# Patient Record
Sex: Female | Born: 1990 | Race: Black or African American | Hispanic: No | Marital: Single | Smoking: Current every day smoker
Health system: Southern US, Community
[De-identification: ages and names within clinical notes are randomized; demographics above are authoritative.]

## PROBLEM LIST (undated history)

## (undated) DIAGNOSIS — G932 Benign intracranial hypertension: Secondary | ICD-10-CM

---

## 2013-02-21 ENCOUNTER — Encounter (HOSPITAL_COMMUNITY): Payer: Self-pay | Admitting: *Deleted

## 2013-02-21 ENCOUNTER — Emergency Department (HOSPITAL_COMMUNITY)
Admission: EM | Admit: 2013-02-21 | Discharge: 2013-02-21 | Disposition: A | Discharge status: Home / Self Care | Payer: Self-pay | Attending: Emergency Medicine | Admitting: Emergency Medicine

## 2013-02-21 DIAGNOSIS — R112 Nausea with vomiting, unspecified: Secondary | ICD-10-CM | POA: Insufficient documentation

## 2013-02-21 DIAGNOSIS — Z91040 Latex allergy status: Secondary | ICD-10-CM | POA: Insufficient documentation

## 2013-02-21 DIAGNOSIS — Z3202 Encounter for pregnancy test, result negative: Secondary | ICD-10-CM | POA: Insufficient documentation

## 2013-02-21 DIAGNOSIS — F17200 Nicotine dependence, unspecified, uncomplicated: Secondary | ICD-10-CM | POA: Insufficient documentation

## 2013-02-21 DIAGNOSIS — M549 Dorsalgia, unspecified: Secondary | ICD-10-CM | POA: Insufficient documentation

## 2013-02-21 DIAGNOSIS — N920 Excessive and frequent menstruation with regular cycle: Secondary | ICD-10-CM | POA: Insufficient documentation

## 2013-02-21 LAB — URINALYSIS, ROUTINE W REFLEX MICROSCOPIC
Glucose, UA: NEGATIVE mg/dL
Leukocytes, UA: NEGATIVE
Nitrite: NEGATIVE
Protein, ur: NEGATIVE mg/dL
pH: 6 (ref 5.0–8.0)

## 2013-02-21 LAB — PREGNANCY, URINE: Preg Test, Ur: NEGATIVE

## 2013-02-21 LAB — WET PREP, GENITAL: Yeast Wet Prep HPF POC: NEGATIVE — AB

## 2013-02-21 MED ORDER — AZITHROMYCIN 1 G PO PACK
1.0000 g | PACK | Freq: Once | ORAL | Status: AC
  Administered 2013-02-21: 1 g via ORAL
  Filled 2013-02-21: qty 1

## 2013-02-21 MED ORDER — CEFTRIAXONE SODIUM 250 MG IJ SOLR
250.0000 mg | Freq: Once | INTRAMUSCULAR | Status: AC
  Administered 2013-02-21: 250 mg via INTRAMUSCULAR
  Filled 2013-02-21: qty 250

## 2013-02-21 MED ORDER — ONDANSETRON 4 MG PO TBDP
8.0000 mg | ORAL_TABLET | Freq: Once | ORAL | Status: AC
  Administered 2013-02-21: 8 mg via ORAL
  Filled 2013-02-21: qty 2

## 2013-02-21 MED ORDER — HYDROMORPHONE HCL PF 1 MG/ML IJ SOLN
1.0000 mg | Freq: Once | INTRAMUSCULAR | Status: AC
  Administered 2013-02-21: 1 mg via INTRAMUSCULAR
  Filled 2013-02-21: qty 1

## 2013-02-21 MED ORDER — LIDOCAINE HCL (PF) 1 % IJ SOLN
INTRAMUSCULAR | Status: AC
  Administered 2013-02-21: 1 mL
  Filled 2013-02-21: qty 5

## 2013-02-21 NOTE — ED Provider Notes (Signed)
CSN: [id number]     Arrival date & time 02/21/13  1815 History     First MD Initiated Contact with Patient 02/21/13 1818     Chief Complaint  Patient presents with  . Abdominal Pain   (Consider location/radiation/quality/duration/timing/severity/associated sxs/prior Treatment) HPI Comments: Patient is a G0P0 22 yo F presenting to the ED for acute onset intermittent sharp right sided pelvic pain w/ radiation to back and left side of pelvis. Patient rates her pain currently 8/10 with marijuana use alleviating her pain. She denies any aggravating pain. Patient has associated nausea and one episode of vomiting this afternoon after trying milk of magnesia for her abdominal pain. Pt states she typically has very painful first days of her menstrual cycles, but was concerned after she vomited. She states she has only gone through one menstrual pad today and is not currently on any form of birth control. Patient endorses recent history of unprotected sexual intercourse. She denies any history of STIs. She denies any fevers or chills, diarrhea, urinary symptoms.   Patient is a 22 y.o. female presenting with abdominal pain.  Abdominal Pain Associated symptoms include nausea and vomiting. Pertinent negatives include no chills or fever.     History reviewed. No pertinent past medical history. History reviewed. No pertinent past surgical history. History reviewed. No pertinent family history. History  Substance Use Topics  . Smoking status: Current Every Day Smoker    Types: Cigarettes  . Smokeless tobacco: Never Used  . Alcohol Use: Yes   OB History   Grav Para Term Preterm Abortions TAB SAB Ect Mult Living                 Review of Systems  Constitutional: Negative for fever and chills.  Gastrointestinal: Positive for nausea and vomiting. Negative for diarrhea, constipation and blood in stool.  Genitourinary: Positive for vaginal bleeding, menstrual problem and pelvic pain.  Musculoskeletal:  Positive for back pain.  Skin: Negative.   Allergic/Immunologic: Positive for food allergies.  All other systems reviewed and are negative.    Allergies  Coconut fatty acids; Shellfish allergy; Lactose intolerance (gi); and Latex  Home Medications   Current Outpatient Rx  Name  Route  Sig  Dispense  Refill  . Magnesium Hydroxide (MILK OF MAGNESIA PO)   Oral   Take 1 Units by mouth daily as needed (for nausea).          BP 112/60  Pulse 78  Temp(Src) 98.6 F (37 C) (Oral)  Resp 20  SpO2 100%  LMP 02/21/2013 Physical Exam  Constitutional: She is oriented to person, place, and time. She appears well-developed and well-nourished.  HENT:  Head: Normocephalic and atraumatic.  Mouth/Throat: Oropharynx is clear and moist.  Eyes: Conjunctivae are normal.  Neck: Neck supple.  Cardiovascular: Normal rate, regular rhythm, normal heart sounds and intact distal pulses.   Pulmonary/Chest: Effort normal and breath sounds normal.  Abdominal: Soft. Bowel sounds are normal. She exhibits no distension. There is no tenderness. There is no rebound and no guarding.  Musculoskeletal: Normal range of motion. She exhibits no edema.  Neurological: She is alert and oriented to person, place, and time.  Skin: Skin is warm and dry.   Exam performed by Francee Piccolo L,  exam chaperoned Date: 02/21/2013 Pelvic exam: normal external genitalia without evidence of trauma. VULVA: normal appearing vulva with no masses, tenderness or lesion. VAGINA: normal appearing vagina with normal color and discharge, no lesions. CERVIX: normal appearing cervix without lesions, cervical  motion tenderness absent, cervical os closed with out purulent discharge; vaginal discharge - bloody, Wet prep and DNA probe for chlamydia and GC obtained.   ADNEXA: normal adnexa in size, nontender and no masses UTERUS: uterus is normal size, shape, consistency and nontender.    ED Course   Procedures (including critical  care time)  Labs Reviewed  WET PREP, GENITAL - Abnormal; Notable for the following:    Yeast Wet Prep HPF POC NEGATIVE (*)    Trich, Wet Prep NEGATIVE (*)    Clue Cells Wet Prep HPF POC NEGATIVE (*)    WBC, Wet Prep HPF POC FEW (*)    All other components within normal limits  URINALYSIS, ROUTINE W REFLEX MICROSCOPIC - Abnormal; Notable for the following:    APPearance CLOUDY (*)    Ketones, ur 15 (*)    All other components within normal limits  GC/CHLAMYDIA PROBE AMP  PREGNANCY, URINE   No results found. 1. Menorrhagia     MDM  Patient is nontoxic, nonseptic appearing, in no apparent distress.  Patient's pain and other symptoms adequately managed in emergency department.  Fluids tolerated in ED.  Labs, and vitals reviewed.  Patient does not meet the SIRS or Sepsis criteria.  On repeat exam patient does not have a surgical abdomen and there are nor peritoneal signs.  No indication of appendicitis, bowel obstruction, bowel perforation, cholecystitis, diverticulitis, PID or ectopic pregnancy.  Pt will be treated with Rocephin and Zithromax in ED with history of recent unprotected sex and WBCs on Wet prep. Patient discharged home with symptomatic treatment and given strict instructions for follow-up with their primary care physician.  I have also discussed reasons to return immediately to the ER.  Advised Gyn f/u for recurrent menstrual cycle pain. Patient expresses understanding and agrees with plan. Patient d/w with Dr. Radford Pax, agrees with plan.        Sandra Ellis, PA-C 02/22/13 [phone]

## 2013-02-21 NOTE — ED Notes (Signed)
Pt.has c/o abdominal pain and has started her period today.

## 2013-02-25 NOTE — ED Provider Notes (Signed)
Medical screening examination/treatment/procedure(s) were performed by non-physician practitioner and as supervising physician I was immediately available for consultation/collaboration.    Valentina Alcoser L Joselyne Spake, MD 02/25/13 0811 

## 2013-03-11 ENCOUNTER — Telehealth (HOSPITAL_COMMUNITY): Payer: Self-pay | Admitting: *Deleted

## 2013-03-29 ENCOUNTER — Telehealth (HOSPITAL_COMMUNITY): Payer: Self-pay | Admitting: Emergency Medicine

## 2013-03-29 ENCOUNTER — Encounter (HOSPITAL_COMMUNITY): Payer: Self-pay | Admitting: *Deleted

## 2013-03-29 ENCOUNTER — Emergency Department (HOSPITAL_COMMUNITY)
Admission: EM | Admit: 2013-03-29 | Discharge: 2013-03-29 | Discharge status: Against Medical Advice | Payer: Self-pay | Attending: Emergency Medicine | Admitting: Emergency Medicine

## 2013-03-29 ENCOUNTER — Emergency Department (HOSPITAL_COMMUNITY)
Admission: EM | Admit: 2013-03-29 | Discharge: 2013-03-29 | Disposition: A | Discharge status: Home / Self Care | Payer: Self-pay | Attending: Emergency Medicine | Admitting: Emergency Medicine

## 2013-03-29 ENCOUNTER — Encounter (HOSPITAL_COMMUNITY): Payer: Self-pay | Admitting: Emergency Medicine

## 2013-03-29 DIAGNOSIS — F17200 Nicotine dependence, unspecified, uncomplicated: Secondary | ICD-10-CM | POA: Insufficient documentation

## 2013-03-29 DIAGNOSIS — R112 Nausea with vomiting, unspecified: Secondary | ICD-10-CM | POA: Insufficient documentation

## 2013-03-29 DIAGNOSIS — G932 Benign intracranial hypertension: Secondary | ICD-10-CM | POA: Insufficient documentation

## 2013-03-29 DIAGNOSIS — Z91040 Latex allergy status: Secondary | ICD-10-CM | POA: Insufficient documentation

## 2013-03-29 DIAGNOSIS — J029 Acute pharyngitis, unspecified: Secondary | ICD-10-CM | POA: Insufficient documentation

## 2013-03-29 DIAGNOSIS — R109 Unspecified abdominal pain: Secondary | ICD-10-CM | POA: Insufficient documentation

## 2013-03-29 HISTORY — DX: Benign intracranial hypertension: G93.2

## 2013-03-29 LAB — CBC WITH DIFFERENTIAL/PLATELET
Eosinophils Absolute: 0 10*3/uL (ref 0.0–0.7)
Eosinophils Relative: 0 % (ref 0–5)
HCT: 37.8 % (ref 36.0–46.0)
Lymphocytes Relative: 17 % (ref 12–46)
Lymphs Abs: 1.9 10*3/uL (ref 0.7–4.0)
MCH: 33.8 pg (ref 26.0–34.0)
MCV: 94.5 fL (ref 78.0–100.0)
Monocytes Absolute: 0.7 10*3/uL (ref 0.1–1.0)
Monocytes Relative: 6 % (ref 3–12)
Platelets: 190 10*3/uL (ref 150–400)
RBC: 4 MIL/uL (ref 3.87–5.11)
WBC: 11.1 10*3/uL — ABNORMAL HIGH (ref 4.0–10.5)

## 2013-03-29 LAB — COMPREHENSIVE METABOLIC PANEL
ALT: 18 U/L (ref 0–35)
BUN: 10 mg/dL (ref 6–23)
CO2: 20 mEq/L (ref 19–32)
Calcium: 9.5 mg/dL (ref 8.4–10.5)
Creatinine, Ser: 0.74 mg/dL (ref 0.50–1.10)
GFR calc Af Amer: >90 mL/min (ref >90)
GFR calc non Af Amer: >90 mL/min (ref >90)
Glucose, Bld: 100 mg/dL — ABNORMAL HIGH (ref 70–99)
Sodium: 138 mEq/L (ref 135–145)
Total Protein: 7.7 g/dL (ref 6.0–8.3)

## 2013-03-29 MED ORDER — SODIUM CHLORIDE 0.9 % IV BOLUS (SEPSIS)
1000.0000 mL | Freq: Once | INTRAVENOUS | Status: AC
  Administered 2013-03-29: 1000 mL via INTRAVENOUS

## 2013-03-29 MED ORDER — ONDANSETRON HCL 4 MG PO TABS: 4.0000 mg | ORAL_TABLET | Freq: Four times a day (QID) | ORAL | Status: DC

## 2013-03-29 MED ORDER — ONDANSETRON HCL 4 MG/2ML IJ SOLN
4.0000 mg | Freq: Once | INTRAMUSCULAR | Status: AC
  Administered 2013-03-29: 4 mg via INTRAVENOUS
  Filled 2013-03-29: qty 2

## 2013-03-29 MED ORDER — DICYCLOMINE HCL 10 MG PO CAPS
10.0000 mg | ORAL_CAPSULE | Freq: Once | ORAL | Status: AC
  Administered 2013-03-29: 10 mg via ORAL
  Filled 2013-03-29: qty 1

## 2013-03-29 NOTE — ED Notes (Signed)
Called in triage with no answer at 1555

## 2013-03-29 NOTE — ED Notes (Signed)
Vomiting for one hour.  lmp last saturday

## 2013-03-29 NOTE — ED Provider Notes (Signed)
CSN: [id number]     Arrival date & time 03/29/13  0205 History   First MD Initiated Contact with Patient 03/29/13 0221     Chief Complaint  Patient presents with  . Emesis   (Consider location/radiation/quality/duration/timing/severity/associated sxs/prior Treatment) Patient is a 22 y.o. female presenting with vomiting. The history is provided by the patient. No language interpreter was used.  Emesis Severity:  Moderate Duration:  1 hour Timing:  Intermittent Number of daily episodes:  11 Quality:  Undigested food and bilious material Able to tolerate:  Liquids and solids Progression:  Unchanged Chronicity:  New Recent urination:  Normal Relieved by:  Nothing Worsened by:  Nothing tried Ineffective treatments:  None tried Associated symptoms: abdominal pain (soreness after multiple episodes of emesis, worse w/ vomiting)   Associated symptoms: no arthralgias, no chills, no cough, no diarrhea, no fever, no headaches, no myalgias, no sore throat and no URI     Past Medical History  Diagnosis Date  . Pseudotumor cerebri    History reviewed. No pertinent past surgical history. No family history on file. History  Substance Use Topics  . Smoking status: Current Every Day Smoker    Types: Cigarettes  . Smokeless tobacco: Never Used  . Alcohol Use: Yes   OB History   Grav Para Term Preterm Abortions TAB SAB Ect Mult Living                 Review of Systems  Constitutional: Negative for fever, chills, diaphoresis, activity change, appetite change and fatigue.  HENT: Negative for congestion, sore throat, facial swelling, rhinorrhea, neck pain and neck stiffness.   Eyes: Negative for photophobia and discharge.  Respiratory: Negative for cough, chest tightness and shortness of breath.   Cardiovascular: Negative for chest pain, palpitations and leg swelling.  Gastrointestinal: Positive for vomiting and abdominal pain (soreness after multiple episodes of emesis, worse w/ vomiting).  Negative for nausea and diarrhea.  Endocrine: Negative for polydipsia and polyuria.  Genitourinary: Negative for dysuria, frequency, difficulty urinating and pelvic pain.  Musculoskeletal: Negative for myalgias, back pain and arthralgias.  Skin: Negative for color change and wound.  Allergic/Immunologic: Negative for immunocompromised state.  Neurological: Negative for facial asymmetry, weakness, numbness and headaches.  Hematological: Does not bruise/bleed easily.  Psychiatric/Behavioral: Negative for confusion and agitation.    Allergies  Coconut fatty acids; Shellfish allergy; Lactose intolerance (gi); and Latex  Home Medications   Current Outpatient Rx  Name  Route  Sig  Dispense  Refill  . Magnesium Hydroxide (MILK OF MAGNESIA PO)   Oral   Take 30 mLs by mouth daily as needed (for nausea).          . ondansetron (ZOFRAN) 4 MG tablet   Oral   Take 4 mg by mouth every 6 (six) hours as needed for nausea.          BP 111/64  Pulse 72  Temp(Src) 97.5 F (36.4 C)  Resp 20  SpO2 100%  LMP 03/27/2013 Physical Exam  Constitutional: She is oriented to person, place, and time. She appears well-developed and well-nourished. No distress.  HENT:  Head: Normocephalic and atraumatic.  Mouth/Throat: No oropharyngeal exudate.  Eyes: Pupils are equal, round, and reactive to light.  Neck: Normal range of motion. Neck supple.  Cardiovascular: Normal rate, regular rhythm and normal heart sounds.  Exam reveals no gallop and no friction rub.   No murmur heard. Pulmonary/Chest: Effort normal and breath sounds normal. No respiratory distress. She has no wheezes.  She has no rales.  Abdominal: Soft. Bowel sounds are normal. She exhibits no distension and no mass. There is no tenderness. There is no rebound and no guarding.  Musculoskeletal: Normal range of motion. She exhibits no edema and no tenderness.  Neurological: She is alert and oriented to person, place, and time.  Skin: Skin is  warm and dry.  Psychiatric: She has a normal mood and affect.    ED Course  Procedures (including critical care time) Labs Review Labs Reviewed  CBC WITH DIFFERENTIAL - Abnormal; Notable for the following:    WBC 11.1 (*)    Neutro Abs 8.5 (*)    All other components within normal limits  COMPREHENSIVE METABOLIC PANEL - Abnormal; Notable for the following:    Potassium 3.2 (*)    Glucose, Bld 100 (*)    Alkaline Phosphatase 36 (*)    Total Bilirubin 1.4 (*)    All other components within normal limits   Imaging Review No results found.  MDM   1. Nausea & vomiting    Pt is a 22 y.o. female with Pmhx as above who presents with 1 hrs of n/v, now with abdominal soreness.  VSS< pt in NAD on PE, abdomen non-tender.  She has mild leukocytosis, CMP unremarkable except for K 3.2, tBILI 1.4.  Symptoms resolved after IVF & zofran.  She would not stay for POC preg test, understands I cannot r/o pregnancy.  I doubt acute surgical emergency and suspect early viral gastroenteritis.   1. Nausea & vomiting         Shanna Cisco, MD 03/29/13 2104

## 2013-03-29 NOTE — ED Notes (Signed)
NO ANSWER IN LOBBY

## 2013-03-29 NOTE — ED Provider Notes (Signed)
CSN: [id number]     Arrival date & time 03/29/13  1203 History   First MD Initiated Contact with Patient 03/29/13 1526     Chief Complaint  Patient presents with  . Sore Throat   (Consider location/radiation/quality/duration/timing/severity/associated sxs/prior Treatment) HPI  PT LWBS from triage   Past Medical History  Diagnosis Date  . Pseudotumor cerebri    History reviewed. No pertinent past surgical history. History reviewed. No pertinent family history. History  Substance Use Topics  . Smoking status: Current Every Day Smoker    Types: Cigarettes  . Smokeless tobacco: Never Used  . Alcohol Use: Yes   OB History   Grav Para Term Preterm Abortions TAB SAB Ect Mult Living                 Review of Systems  Allergies  Coconut fatty acids; Shellfish allergy; Lactose intolerance (gi); and Latex  Home Medications   Current Outpatient Rx  Name  Route  Sig  Dispense  Refill  . Magnesium Hydroxide (MILK OF MAGNESIA PO)   Oral   Take 30 mLs by mouth daily as needed (for nausea).          . ondansetron (ZOFRAN) 4 MG tablet   Oral   Take 4 mg by mouth every 6 (six) hours as needed for nausea.          BP 124/76  Pulse 76  Temp(Src) 98.4 F (36.9 C) (Oral)  Resp 18  SpO2 99%  LMP 03/27/2013 Physical Exam  ED Course  Procedures (including critical care time) Labs Review Labs Reviewed - No data to display Imaging Review No results found.  MDM  No diagnosis found.   PT LWBS from triage    Wynetta Emery, PA-C 03/29/13 2358

## 2013-03-29 NOTE — ED Notes (Signed)
Pt sts seen here last night and given meds for vomiting; pt sts sore throat prior to vomiting but now having more pain and thinks could be reaction to meds given here; pt in no distress handling secretions and speaking clearly; no abnormality noted to throat

## 2013-03-31 NOTE — ED Provider Notes (Signed)
Medical screening examination/treatment/procedure(s) were performed by non-physician practitioner and as supervising physician I was immediately available for consultation/collaboration.   Erendida Wrenn, MD 03/31/13 1518 

## 2013-05-23 ENCOUNTER — Encounter (HOSPITAL_COMMUNITY): Payer: Self-pay | Admitting: Emergency Medicine

## 2013-05-23 ENCOUNTER — Emergency Department (HOSPITAL_COMMUNITY)
Admission: EM | Admit: 2013-05-23 | Discharge: 2013-05-24 | Disposition: A | Discharge status: Home / Self Care | Payer: Self-pay | Attending: Emergency Medicine | Admitting: Emergency Medicine

## 2013-05-23 ENCOUNTER — Emergency Department (HOSPITAL_COMMUNITY)
Admission: EM | Admit: 2013-05-23 | Discharge: 2013-05-23 | Disposition: A | Discharge status: Home / Self Care | Payer: Self-pay | Attending: Emergency Medicine | Admitting: Emergency Medicine

## 2013-05-23 ENCOUNTER — Emergency Department (HOSPITAL_COMMUNITY): Payer: Self-pay

## 2013-05-23 DIAGNOSIS — Z3202 Encounter for pregnancy test, result negative: Secondary | ICD-10-CM | POA: Insufficient documentation

## 2013-05-23 DIAGNOSIS — G932 Benign intracranial hypertension: Secondary | ICD-10-CM | POA: Insufficient documentation

## 2013-05-23 DIAGNOSIS — Z91040 Latex allergy status: Secondary | ICD-10-CM | POA: Insufficient documentation

## 2013-05-23 DIAGNOSIS — Z79899 Other long term (current) drug therapy: Secondary | ICD-10-CM | POA: Insufficient documentation

## 2013-05-23 DIAGNOSIS — M549 Dorsalgia, unspecified: Secondary | ICD-10-CM | POA: Insufficient documentation

## 2013-05-23 DIAGNOSIS — F17200 Nicotine dependence, unspecified, uncomplicated: Secondary | ICD-10-CM | POA: Insufficient documentation

## 2013-05-23 DIAGNOSIS — R112 Nausea with vomiting, unspecified: Secondary | ICD-10-CM | POA: Insufficient documentation

## 2013-05-23 DIAGNOSIS — G971 Other reaction to spinal and lumbar puncture: Secondary | ICD-10-CM | POA: Insufficient documentation

## 2013-05-23 DIAGNOSIS — Z888 Allergy status to other drugs, medicaments and biological substances status: Secondary | ICD-10-CM | POA: Insufficient documentation

## 2013-05-23 DIAGNOSIS — R51 Headache: Secondary | ICD-10-CM | POA: Insufficient documentation

## 2013-05-23 LAB — BASIC METABOLIC PANEL
BUN: 4 mg/dL — ABNORMAL LOW (ref 6–23)
CO2: 18 mEq/L — ABNORMAL LOW (ref 19–32)
Chloride: 104 mEq/L (ref 96–112)
GFR calc Af Amer: >90 mL/min (ref >90)
Potassium: 3.7 mEq/L (ref 3.5–5.1)

## 2013-05-23 LAB — CBC WITH DIFFERENTIAL/PLATELET
Basophils Absolute: 0 10*3/uL (ref 0.0–0.1)
Basophils Relative: 0 % (ref 0–1)
HCT: 40 % (ref 36.0–46.0)
Hemoglobin: 13.7 g/dL (ref 12.0–15.0)
Lymphocytes Relative: 23 % (ref 12–46)
MCHC: 34.3 g/dL (ref 30.0–36.0)
Monocytes Absolute: 0.7 10*3/uL (ref 0.1–1.0)
Monocytes Relative: 7 % (ref 3–12)
Neutro Abs: 6.4 10*3/uL (ref 1.7–7.7)
Neutrophils Relative %: 69 % (ref 43–77)
WBC: 9.2 10*3/uL (ref 4.0–10.5)

## 2013-05-23 LAB — URINALYSIS, ROUTINE W REFLEX MICROSCOPIC
Glucose, UA: NEGATIVE mg/dL
Leukocytes, UA: NEGATIVE
Nitrite: NEGATIVE
Specific Gravity, Urine: 1.007 (ref 1.005–1.030)
pH: 7 (ref 5.0–8.0)

## 2013-05-23 MED ORDER — SODIUM CHLORIDE 0.9 % IV BOLUS (SEPSIS)
1000.0000 mL | Freq: Once | INTRAVENOUS | Status: AC
  Administered 2013-05-24: 1000 mL via INTRAVENOUS

## 2013-05-23 MED ORDER — ONDANSETRON HCL 4 MG/2ML IJ SOLN: 4.0000 mg | Freq: Once | INTRAMUSCULAR | Status: DC

## 2013-05-23 MED ORDER — METOCLOPRAMIDE HCL 5 MG/ML IJ SOLN
10.0000 mg | Freq: Once | INTRAMUSCULAR | Status: AC
  Administered 2013-05-23: 10 mg via INTRAVENOUS
  Filled 2013-05-23: qty 2

## 2013-05-23 MED ORDER — HYDROMORPHONE HCL PF 1 MG/ML IJ SOLN
1.0000 mg | Freq: Once | INTRAMUSCULAR | Status: AC
  Administered 2013-05-23: 1 mg via INTRAVENOUS
  Filled 2013-05-23: qty 1

## 2013-05-23 MED ORDER — OXYCODONE-ACETAMINOPHEN 5-325 MG PO TABS: 2.0000 | ORAL_TABLET | ORAL | Status: DC | PRN

## 2013-05-23 MED ORDER — SODIUM CHLORIDE 0.9 % IV BOLUS (SEPSIS)
1000.0000 mL | Freq: Once | INTRAVENOUS | Status: AC
  Administered 2013-05-23: 1000 mL via INTRAVENOUS

## 2013-05-23 NOTE — ED Notes (Signed)
Pt reports having oxycodone x 2 tabs at and a shot  Of phenergan 4 or 5pm today.

## 2013-05-23 NOTE — ED Notes (Signed)
Dr Manly at bedside 

## 2013-05-23 NOTE — ED Notes (Signed)
Per PTAR, pt has hx of migraines and pseudo tumors. Pt has had a migraine x1 week. Pt had spinal tap performed yesterday. Pt was seen here earlier today. CBG 64. Pt has nausea and vomiting with movement.

## 2013-05-23 NOTE — ED Notes (Signed)
Report received, assumed care.  

## 2013-05-23 NOTE — ED Provider Notes (Addendum)
CSN: [id number]     Arrival date & time 05/23/13  0443 History   First MD Initiated Contact with Patient 05/23/13 0530     Chief Complaint  Patient presents with  . Back Pain   (Consider location/radiation/quality/duration/timing/severity/associated sxs/prior Treatment) HPI This patient is a very pleasant 22 year old woman with history of pseudotumor cerebri diagnosed in 2011. She is today status post therapeutic lumbar puncture performed at [facility]. The patient says she was told that her opening pressure was 24 mmH2O. prior to lumbar puncture, she had one week of severe and diffuse headache pain associated with nausea but no vomiting. Her pain was similar to that which she experienced when she was initially diagnosed with pseudotumor 2001. She has not been able to afford her prescription medications.  The patient developed worsening and severe headache pain following her discharge from the [facility] department after her lumbar puncture. She returned later in the day and was treated for a post LP headache with, she says, 2 L of normal saline and caffeine and an anti-emetic. She said that she was still feeling bad at the time of her 2nd discharge, although her symptoms had improved with IVF and IV caffeine. She says she was told to return to the hospital to be admitted if her symptoms did not improve.  The patient has had persistent and severe, diffuse, aching pain in the head since 2nd discharge from ED yesterday. Her headache is positional and worse when she sits or stands. She feels most comfortable laying in the left lateral decubitus position. Pain is 10/10. Pt has vomited once. Denies fever. Denies visual changes. Denies any focal neurologic deficits. The patient also has pain at the site of LP. This pain radiates superiorly, up the back. .   Past Medical History  Diagnosis Date  . Pseudotumor cerebri    History reviewed. No pertinent past  surgical history. No family history on file. History  Substance Use Topics  . Smoking status: Current Every Day Smoker    Types: Cigarettes  . Smokeless tobacco: Never Used  . Alcohol Use: Yes   OB History   Grav Para Term Preterm Abortions TAB SAB Ect Mult Living                 Review of Systems Gen: no weight loss, fevers, chills, night sweats Eyes: no discharge or drainage, no occular pain or visual changes Nose: no epistaxis or rhinorrhea Mouth: no dental pain, no sore throat Neck: no neck pain Lungs: no SOB, cough, wheezing CV: no chest pain, palpitations, dependent edema or orthopnea Abd: As per history of present illness, otherwise negative GU: no dysuria or gross hematuria MSK: no myalgias or arthralgias Neuro: As per history of present illness, otherwise negative Skin: no rash Psyche: negative.  Allergies  Coconut fatty acids; Shellfish allergy; Zofran; Lactose intolerance (gi); and Latex  Home Medications  No current outpatient prescriptions on file. BP 120/78  Pulse 66  Temp(Src) 99.4 F (37.4 C) (Oral)  Resp 18  SpO2 100%  LMP 05/13/2013 Physical Exam  Gen: well developed and well nourished appearing, appears uncomfortable, tearful.  Head: NCAT Eyes: PERL, EOMI, mild papilledema is noted on non-dilated exam  Nose: no epistaixis or rhinorrhea Mouth/throat: mucosa is moist and pink Neck: supple, no stridor Lungs: CTA B, no wheezing, rhonchi or rales CV: RRR, no murmur, ext well perfused Abd: soft, notender, nondistended Back:LP site is c/d/i without ttp or surrounding erythema Skin: warm and  dry Neuro: CN ii-xii grossly intact, normal speech, motor strength 5/5 both arms and legs, no dysmetria Psyche; normal affect,  calm and cooperative.  ED Course  Procedures (including critical care time) Labs Review Results for orders placed during the hospital encounter of 05/23/13 (from the past 24 hour(s))  CBC WITH DIFFERENTIAL     Status: None    Collection Time    05/23/13  5:12 AM      Result Value Range   WBC 9.2  4.0 - 10.5 K/uL   RBC 4.18  3.87 - 5.11 MIL/uL   Hemoglobin 13.7  12.0 - 15.0 g/dL   HCT 16.1  09.6 - 04.5 %   MCV 95.7  78.0 - 100.0 fL   MCH 32.8  26.0 - 34.0 pg   MCHC 34.3  30.0 - 36.0 g/dL   RDW 40.9  81.1 - 91.4 %   Platelets 202  150 - 400 K/uL   Neutrophils Relative % 69  43 - 77 %   Neutro Abs 6.4  1.7 - 7.7 K/uL   Lymphocytes Relative 23  12 - 46 %   Lymphs Abs 2.1  0.7 - 4.0 K/uL   Monocytes Relative 7  3 - 12 %   Monocytes Absolute 0.7  0.1 - 1.0 K/uL   Eosinophils Relative 0  0 - 5 %   Eosinophils Absolute 0.0  0.0 - 0.7 K/uL   Basophils Relative 0  0 - 1 %   Basophils Absolute 0.0  0.0 - 0.1 K/uL  BASIC METABOLIC PANEL     Status: Abnormal   Collection Time    05/23/13  5:12 AM      Result Value Range   Sodium 135  135 - 145 mEq/L   Potassium 3.7  3.5 - 5.1 mEq/L   Chloride 104  96 - 112 mEq/L   CO2 18 (*) 19 - 32 mEq/L   Glucose, Bld 97  70 - 99 mg/dL   BUN 4 (*) 6 - 23 mg/dL   Creatinine, Ser 7.82  0.50 - 1.10 mg/dL   Calcium 9.7  8.4 - 95.6 mg/dL   GFR calc non Af Amer >90  >90 mL/min   GFR calc Af Amer >90  >90 mL/min  URINALYSIS, ROUTINE W REFLEX MICROSCOPIC     Status: Abnormal   Collection Time    05/23/13  5:44 AM      Result Value Range   Color, Urine YELLOW  YELLOW   APPearance CLOUDY (*) CLEAR   Specific Gravity, Urine 1.007  1.005 - 1.030   pH 7.0  5.0 - 8.0   Glucose, UA NEGATIVE  NEGATIVE mg/dL   Hgb urine dipstick NEGATIVE  NEGATIVE   Bilirubin Urine NEGATIVE  NEGATIVE   Ketones, ur 40 (*) NEGATIVE mg/dL   Protein, ur NEGATIVE  NEGATIVE mg/dL   Urobilinogen, UA 0.2  0.0 - 1.0 mg/dL   Nitrite NEGATIVE  NEGATIVE   Leukocytes, UA NEGATIVE  NEGATIVE  PREGNANCY, URINE     Status: None   Collection Time    05/23/13  5:44 AM      Result Value Range   Preg Test, Ur NEGATIVE  NEGATIVE   Head CT:  NAICP  MDM   Patient with classic post LP headache. Failed attempt at  medical intervention yesterday at [facility] ED with IVF and caffeine. We are treating symptomatically at this time. Anesthesiology paged to request blood patch.   0710: Case discussed with Dr. Sampson Goon of Anesthesia who  has informed me that the Dept of Radiology with IR is now responsible for performing blood patches.   0720: Case discussed with Dr. Gilford Rile of IR. He says that blood patch is not available via IR on the weekend but, he will speak with the Neuroradiologist who is coming is and see if we can get the procedure performed for the patient. Case signed out to Dr. Jodi Mourning at change of shift.   Brandt Loosen, MD 05/23/13 [phone]  Case discussed with Dr. Donette Larry at [facility]. He says that he is happy to accept the patient to the ED there and attempt to get flouro guided blood patch but, he could not gaurauntee that he would be able to get this procedure done for the patient either. I discussed this with the patient. She is feeling better and says she is able to go home with plan to follow up with [facility] in the morning for outpatient procedure. Counseled re: return precautions.   Brandt Loosen, MD 05/23/13 [phone]

## 2013-05-23 NOTE — ED Notes (Signed)
PER PTAR- pt was seen at [facility] yesterday and had an LP done for her HA and reports increased back pain, HA, right arm pain. Pt also reports N/V/D and "every position hurts." VS: HR-84, O2-96%, RR-18

## 2013-05-24 ENCOUNTER — Emergency Department (HOSPITAL_COMMUNITY): Payer: Self-pay

## 2013-05-24 MED ORDER — IOHEXOL 180 MG/ML  SOLN
20.0000 mL | Freq: Once | INTRAMUSCULAR | Status: AC | PRN
  Administered 2013-05-24: 5 mL via INTRATHECAL

## 2013-05-24 MED ORDER — METOCLOPRAMIDE HCL 5 MG/ML IJ SOLN
10.0000 mg | Freq: Once | INTRAMUSCULAR | Status: AC
  Administered 2013-05-24: 10 mg via INTRAVENOUS
  Filled 2013-05-24: qty 2

## 2013-05-24 MED ORDER — HYDROMORPHONE HCL PF 1 MG/ML IJ SOLN
1.0000 mg | Freq: Once | INTRAMUSCULAR | Status: AC
  Administered 2013-05-24: 1 mg via INTRAVENOUS
  Filled 2013-05-24: qty 1

## 2013-05-24 NOTE — ED Notes (Signed)
Dr. Carlota Raspberry (Interventional radiologist) notified of pt's intolerance of sitting up-- stated that is normal and to be expected. Will get d/c instructions from EDP.

## 2013-05-24 NOTE — Procedures (Signed)
See radiology dictation.

## 2013-05-24 NOTE — ED Provider Notes (Signed)
Pt received in t/o from Dr. Arnoldo Morale at 0800.  22 yo female presenting for HA suspected to be a post LP HA.  Pt is pending a blood patch to be performed by Neuroradiology.    11:42 AM Pt had successful blood patch placement.  I examined pt and she states her HA is much better.  She does report back pain.  Neuroradiology states this is to be expected.  Normal neuro exam on d/c - moving LE w/o issue, sensation intact, DTRs normal.  Lengthy d/w pt, her mom, and her uncle regarding discharge instructions.  Filed Vitals:   05/24/13 1031  BP: 101/70  Pulse: 74  Temp: 98.7 F (37.1 C)  Resp: 14      Pt is to have 24hr bed rest. Pt is to fill her Rx which she was given previously Pt is to f/u with [facility] [facility].   ER precautions for fever, abd pain, persistent n/v, neurologic symptoms or worsening symptoms.  Pt and family agree with plan.    Darlys Gales, MD 05/24/13 1146

## 2013-05-24 NOTE — ED Provider Notes (Signed)
CSN: [id number]     Arrival date & time 05/23/13  2316 History   First MD Initiated Contact with Patient 05/23/13 2326     Chief Complaint  Patient presents with  . Migraine   (Consider location/radiation/quality/duration/timing/severity/associated sxs/prior Treatment) HPI He said that this is a late entry. This patient was seen and examined by me upon her presentation to the emergency department. Please see my note from November 2. The patient was discharged with plan for outpatient blood patch to treat spinal headache. However, she says she developed nausea, vomiting and persistent headache in the waiting room after she was discharge yesterday. She continues to have severe and diffuse headache pain without any new neurologic deficits. She did not get her prescriptions filled.   Past Medical History  Diagnosis Date  . Pseudotumor cerebri    History reviewed. No pertinent past surgical history. No family history on file. History  Substance Use Topics  . Smoking status: Current Every Day Smoker    Types: Cigarettes  . Smokeless tobacco: Never Used  . Alcohol Use: Yes   OB History   Grav Para Term Preterm Abortions TAB SAB Ect Mult Living                 Review of Systems Gen: no weight loss, fevers, chills, night sweats, general malaise is present. Eyes: no discharge or drainage, no occular pain or visual changes Nose: no epistaxis or rhinorrhea Mouth: no dental pain, no sore throat Neck: no neck pain Lungs: no SOB, cough, wheezing CV: no chest pain, palpitations, dependent edema or orthopnea Abd: As per history of present illness, otherwise negative GU: no dysuria or gross hematuria MSK: no myalgias or arthralgias Neuro: Per history of present illness, otherwise negative Skin: no rash Psyche: negative. Allergies  Coconut fatty acids; Shellfish allergy; Zofran; Lactose intolerance (gi); and Latex  Home Medications   Current Outpatient Rx  Name  Route  Sig  Dispense   Refill  . oxyCODONE-acetaminophen (PERCOCET) 5-325 MG per tablet   Oral   Take 2 tablets by mouth every 4 (four) hours as needed for pain.   16 tablet   0   . promethazine (PHENERGAN) 25 MG/ML injection   Intramuscular   Inject 25 mg into the muscle once.          BP 121/66  Pulse 73  Temp(Src) 98.7 F (37.1 C) (Oral)  Resp 16  SpO2 100%  LMP 05/13/2013 Physical Exam Gen: well developed and well nourished appearing Head: NCAT Eyes: PERL, EOMI, papilledema present - no change from 11/2 Nose: no epistaixis or rhinorrhea Mouth/throat: mucosa is moist and pink Neck: supple, no stridor Lungs: CTA B, no wheezing, rhonchi or rales CV: RRR, no murmur,  Abd: soft, notender, nondistended Back: nontender, site of LP is c/d/i, no ttp Skin: warm and dry Neuro: CN ii-xii grossly intact, no focal deficits Psyche; flat affect,  calm and cooperative.   ED Course  Procedures (including critical care time) Labs Review Labs Reviewed - No data to display Imaging Review Ct Head Wo Contrast  05/23/2013   CLINICAL DATA:  Headache. History of pseudotumor cerebrum  EXAM: CT HEAD WITHOUT CONTRAST  TECHNIQUE: Contiguous axial images were obtained from the base of the skull through the vertex without intravenous contrast.  COMPARISON:  None.  FINDINGS: Skull and Sinuses:No cause for headache seen.  Orbits: No acute abnormality.  Brain: No evidence of acute abnormality, such as acute infarction, hemorrhage, hydrocephalus, or mass lesion/mass effect.  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  There is enlargement of the sella by low-attenuation. This "empty sella" appearance is often incidental, but has been associated with pseudotumor cerebrum. No evidence of dural venous sinus thrombosis.  IMPRESSION: No evidence of acute intracranial disease. Non acute findings discussed above.   Electronically Signed   By: Tiburcio Pea M.D.   On: 05/23/2013 06:42    Please see labs from yesterday  MDM    We have treated supportively in the ED as we await the neuroradiology staff to come in so that we may arrange blood patch for persistent post LP headache.    Brandt Loosen, MD 05/24/13 [phone]

## 2013-05-24 NOTE — ED Notes (Signed)
Returned from [place]-- orders received

## 2013-05-24 NOTE — ED Notes (Signed)
Pt has court date today-- due in  Court at 10am- uncle given note to take to court house-- Public Defender Amy called to have another note faxed with current date on. Faxed note to [phone]. Pt unable to appear in court due to care rendered today- will be on bedrest with bathroom privileges for 24 hours.

## 2013-05-24 NOTE — ED Notes (Signed)
Pt went to restroom, when she stood up she became nauseated and vomited once while in the bathroom.

## 2013-05-24 NOTE — Progress Notes (Signed)
Case Manager met patient at bedside.Role of Case Manager explained.Patient verbalizes her understanding.Patient reports she has increased post LP head ache. Patient does not have a PCP. Education provided on importance of establishing a PCP.Patient was provided with a resource sheet for the [facility]gent [facility] center. This Clinical research associate explained to patient With her consent,this Case Manager can e-mail the [facility] and help set up her PCP appointment.Patient receptive to this and provided her consent.Patient provided with resource  Card for the [address].

## 2013-05-24 NOTE — ED Notes (Signed)
Pt asking if she should be admitted because this is the 4th time she has been here and she must need more care.  Advised pt to speak with physician when she came into her room regarding her concerns.

## 2013-05-24 NOTE — ED Notes (Signed)
Tolerating sitting up at 30 degrees. Unable to tolerate raising head of bed further at this time. Used bedpan,

## 2013-07-13 ENCOUNTER — Ambulatory Visit: Payer: Self-pay | Admitting: Internal Medicine

## 2014-01-15 IMAGING — CT CT HEAD W/O CM
1 series · 16 of 30 positions shown, 20 images · non-contrast
Comparison: None.

CLINICAL DATA: Headache. History of pseudotumor cerebrum

EXAM:
CT HEAD WITHOUT CONTRAST
TECHNIQUE: Contiguous axial images were obtained from the base of the skull
through the vertex without intravenous contrast.

[Series 2: head 5.0 h30s · axial · 0.43mm/px · z∈[-136,-1]mm · 16 of 30 slices shown, 20 images]
[im 2/30  brain]
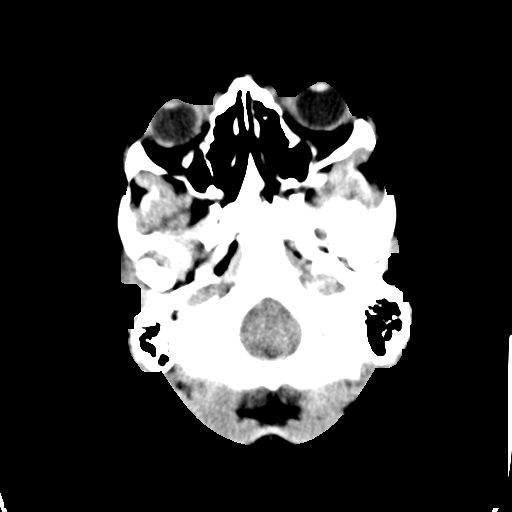
[im 2/30  bone]
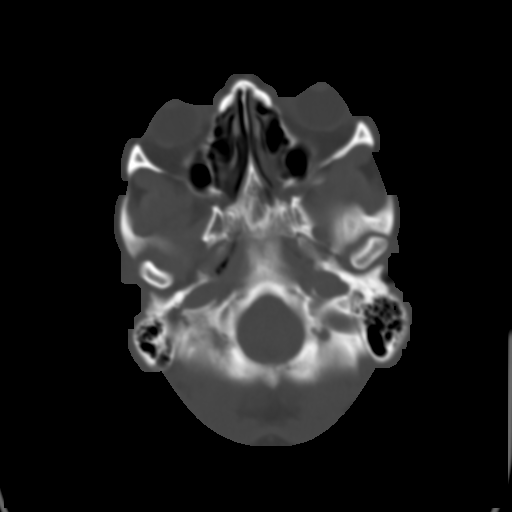
[im 4/30  brain]
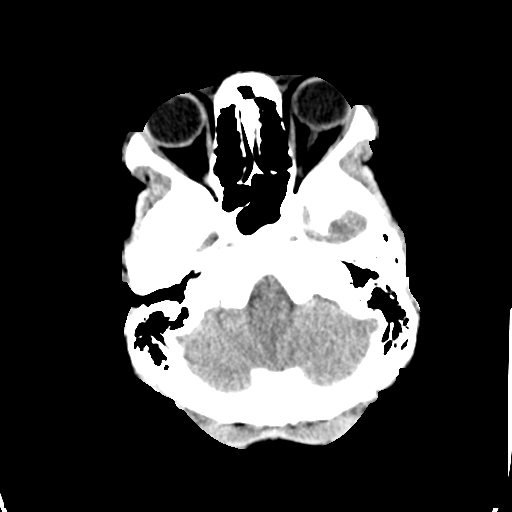
[im 6/30  brain]
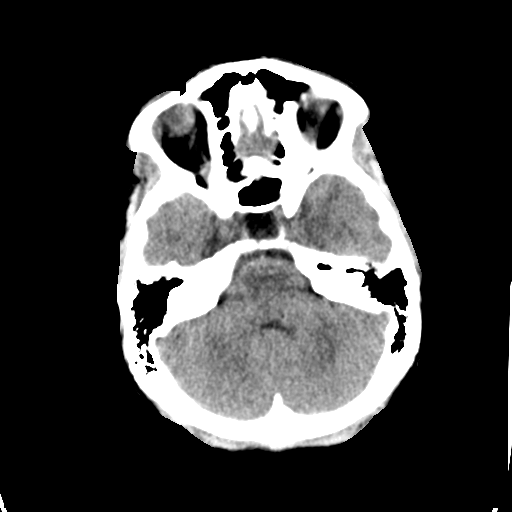
[im 8/30  brain]
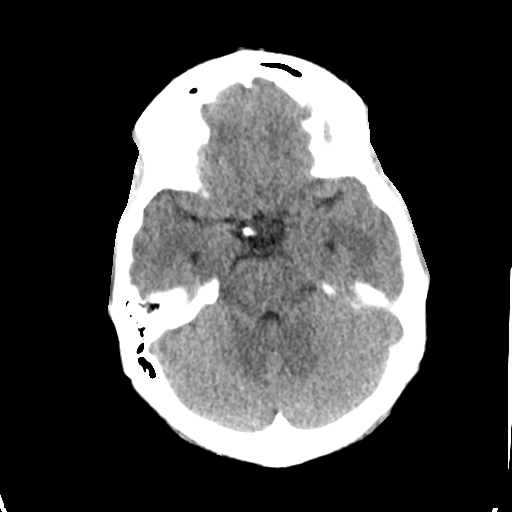
[im 9/30  brain]
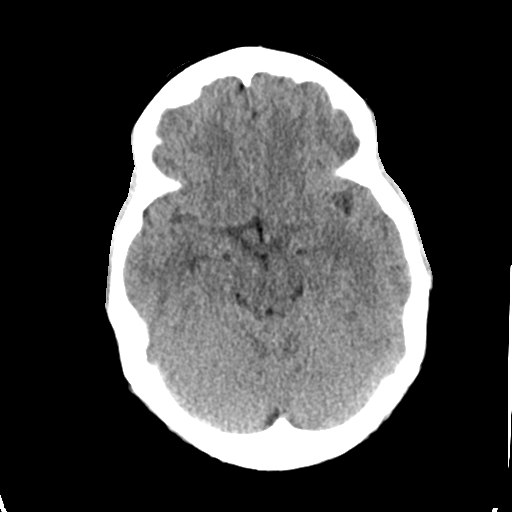
[im 9/30  bone]
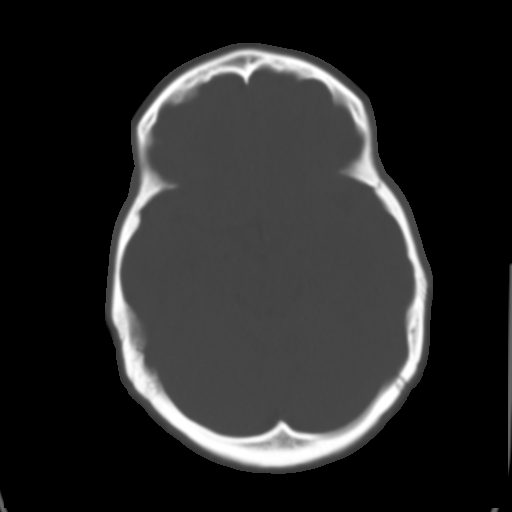
[im 11/30  brain]
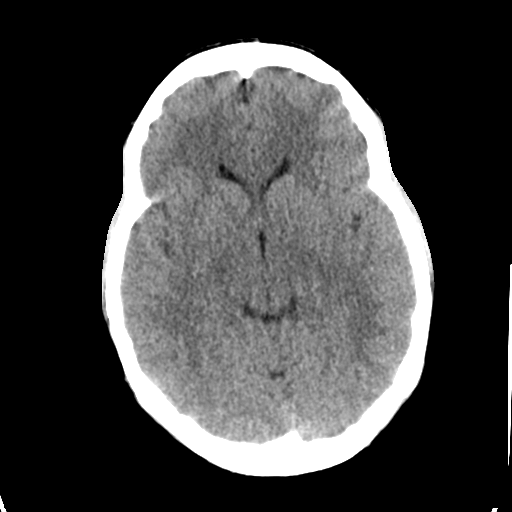
[im 13/30  brain]
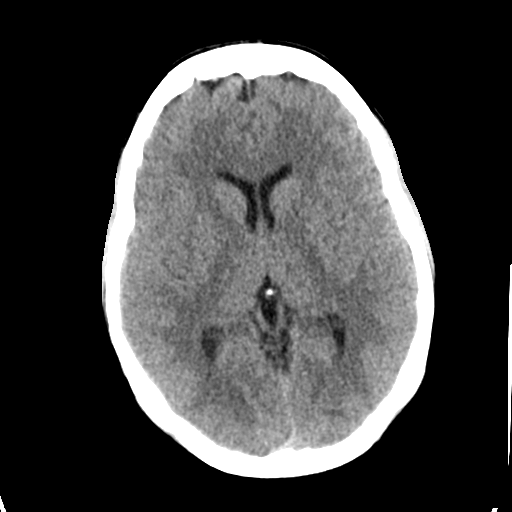
[im 15/30  brain]
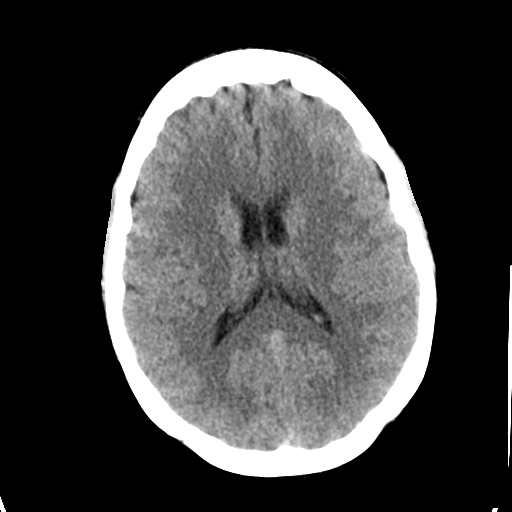
[im 16/30  brain]
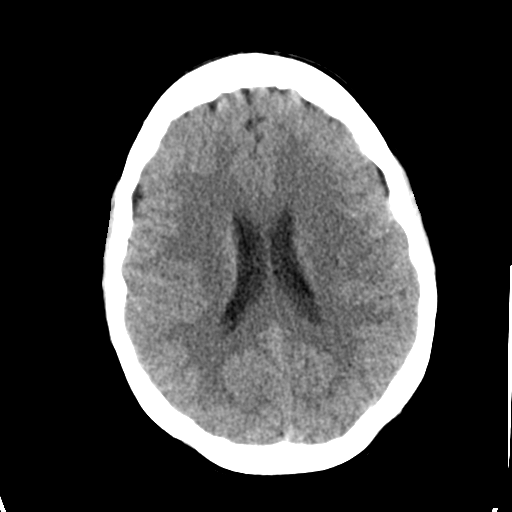
[im 16/30  bone]
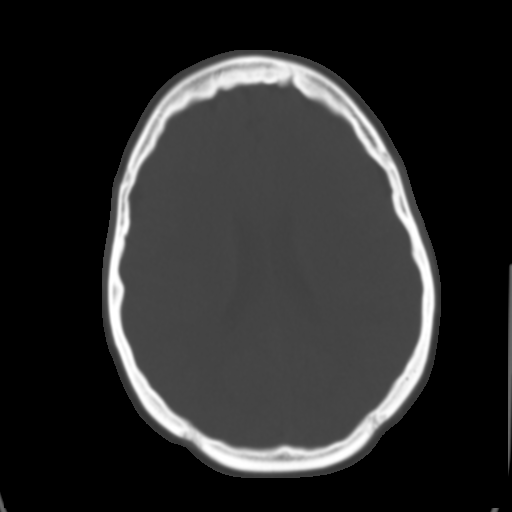
[im 18/30  brain]
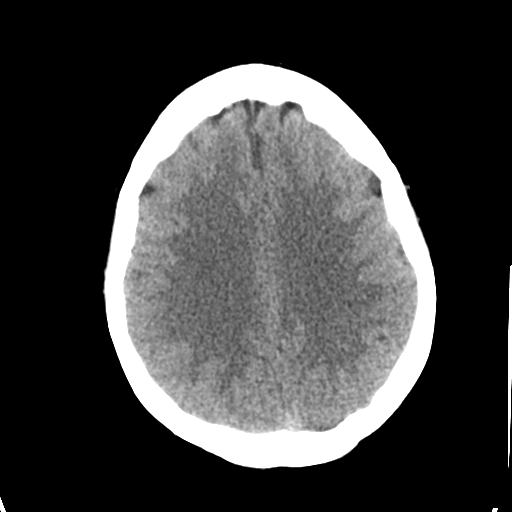
[im 20/30  brain]
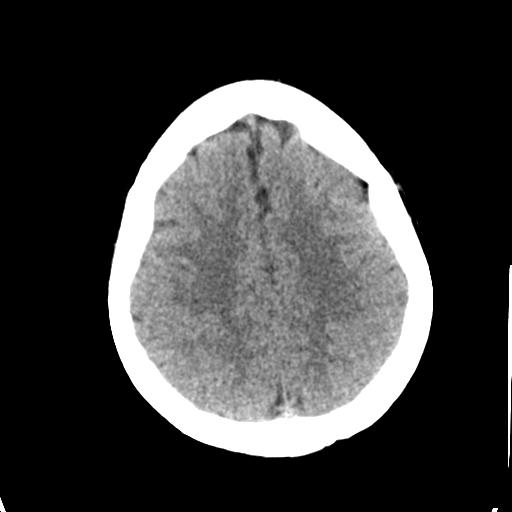
[im 22/30  brain]
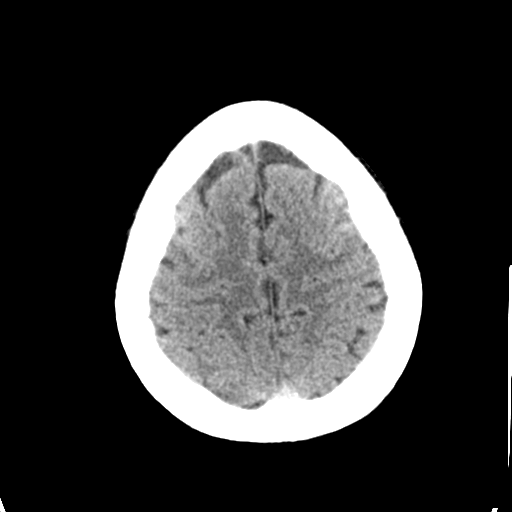
[im 23/30  brain]
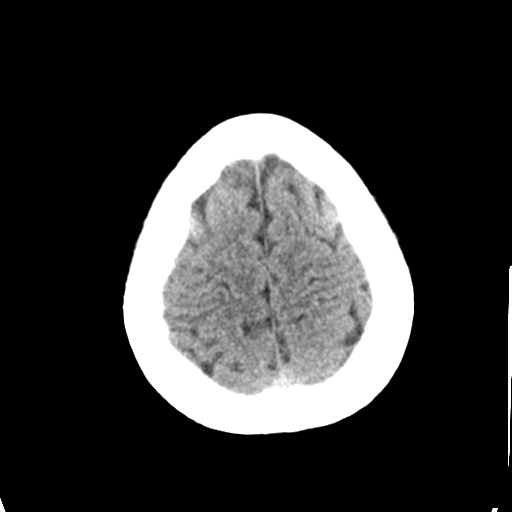
[im 23/30  bone]
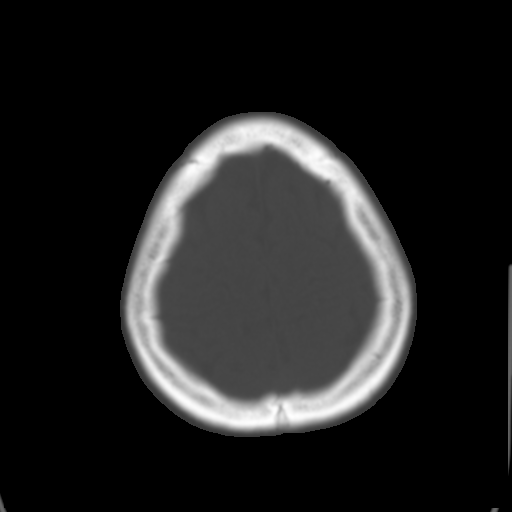
[im 25/30  brain]
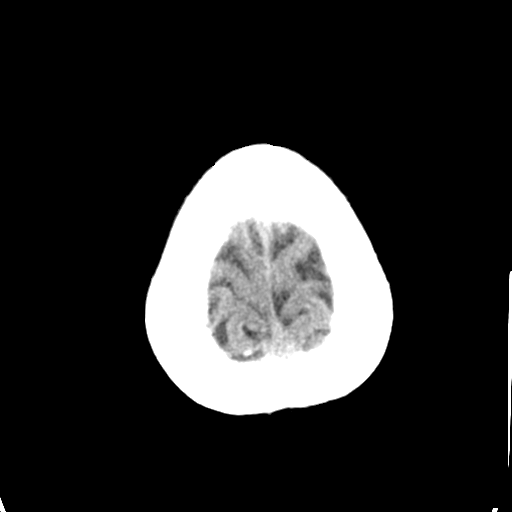
[im 27/30  brain]
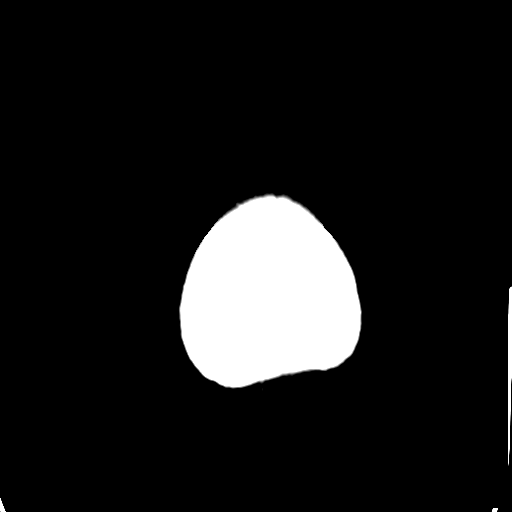
[im 29/30  brain]
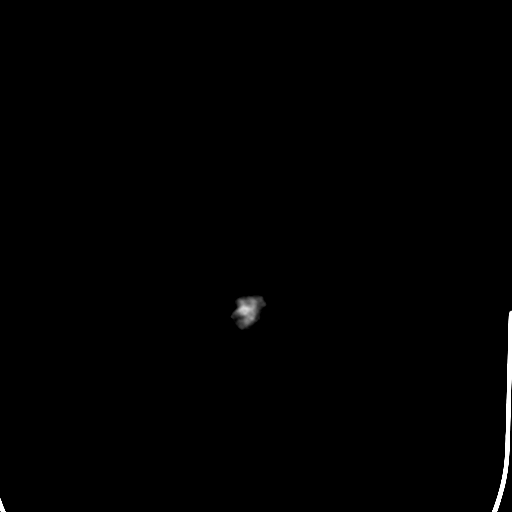

[16 of 30 positions shown; findings below may reference images not displayed]

FINDINGS: Skull and Sinuses:No cause for headache seen.

Orbits: No acute abnormality.

Brain: No evidence of acute abnormality, such as acute infarction,
hemorrhage, hydrocephalus, or mass lesion/mass effect.

The cerebellar tonsils are inferiorly ectopic.

There is enlargement of the sella by low-attenuation. This "empty
sella" appearance is often incidental, but has been associated with
pseudotumor cerebrum. No evidence of dural venous sinus thrombosis.
IMPRESSION: No evidence of acute intracranial disease. Non acute findings
discussed above.

## 2014-01-16 IMAGING — RF DG FLUORO GUIDE LUMBAR PUNCTURE
1 series · 1 of 1 positions shown · non-contrast
Comparison: none

CLINICAL DATA: Post lumbar puncture headache. CSF leak. Pseudotumor
cerebri.

[Series 1: run · 1 of 1 slices shown]
[im 1/1]
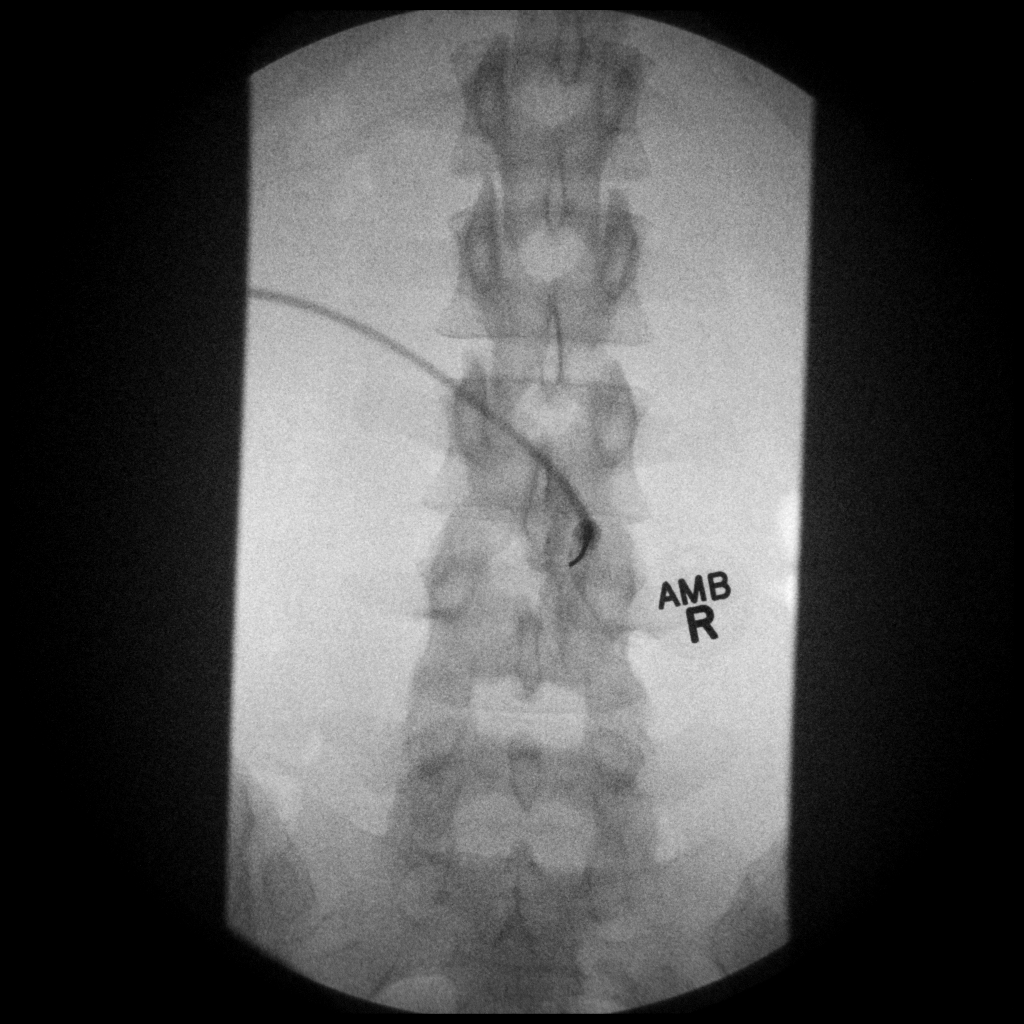

[1 of 1 positions shown; findings below may reference images not displayed]

FLUOROSCOPY TIME:  0 min 26 seconds

PROCEDURE:
LUMBAR EPIDURAL BLOOD PATCH INJECTION

After a thorough discussion of risks and benefits of the procedure,
written and verbal consent was obtained. Specific risks included
puncture of the thecal sac and dura as well as nontherapeutic
injection with general risks of bleeding, infection, injury to
nerves, blood vessels, and adjacent structures. Verbal consent was
obtained by Dr. Thea Louise. We discussed the medium to high probability
of achievement of therapeutic goal, dependent on volume of infusion.

Prior to the procedure, 20 ml of the patient's blood was harvested
using stringent sterile technique.

An interlaminar approach was performed at L3-L4. Under stringent
sterile technique, overlying skin was cleansed with betadine soap
and anesthetized with 1% lidocaine without epinephrine. A 3-1/2 inch
20 gauge needle was advanced using loss-of-resistance technique.

DIAGNOSTIC EPIDURAL INJECTION

Injection of Omnipaque 180 shows a good epidural pattern with spread
above and below the level of needle placement, primarily on the side
of needle placement. No vascular or subarachnoid opacification is
seen.

THERAPEUTIC EPIDURAL INJECTION

20 ml of the patient's blood was injected into the epidural space at
the site of prior lumbar puncture.
IMPRESSION: Technically successful lumbar blood patch at L3-L4.

## 2015-01-22 ENCOUNTER — Emergency Department (HOSPITAL_COMMUNITY)
Admission: EM | Admit: 2015-01-22 | Discharge: 2015-01-22 | Disposition: A | Discharge status: Home / Self Care | Payer: Self-pay | Attending: Emergency Medicine | Admitting: Emergency Medicine

## 2015-01-22 ENCOUNTER — Encounter (HOSPITAL_COMMUNITY): Payer: Self-pay | Admitting: Emergency Medicine

## 2015-01-22 DIAGNOSIS — K029 Dental caries, unspecified: Secondary | ICD-10-CM | POA: Insufficient documentation

## 2015-01-22 DIAGNOSIS — Z720 Tobacco use: Secondary | ICD-10-CM | POA: Insufficient documentation

## 2015-01-22 DIAGNOSIS — K0889 Other specified disorders of teeth and supporting structures: Secondary | ICD-10-CM

## 2015-01-22 DIAGNOSIS — Z91040 Latex allergy status: Secondary | ICD-10-CM | POA: Insufficient documentation

## 2015-01-22 DIAGNOSIS — K088 Other specified disorders of teeth and supporting structures: Secondary | ICD-10-CM | POA: Insufficient documentation

## 2015-01-22 MED ORDER — TRAMADOL HCL 50 MG PO TABS: 50.0000 mg | ORAL_TABLET | Freq: Four times a day (QID) | ORAL | Status: AC | PRN

## 2015-01-22 MED ORDER — PENICILLIN V POTASSIUM 500 MG PO TABS: 500.0000 mg | ORAL_TABLET | Freq: Three times a day (TID) | ORAL | Status: DC

## 2015-01-22 NOTE — ED Notes (Signed)
Pt states she took two Ibuprofen 7 times today. No relief. Pt has chipped tooth in the upper left aspect of mouth.

## 2015-01-22 NOTE — Discharge Instructions (Signed)
Please read and follow all provided instructions.  Your diagnoses today include:  1. Pain, dental     The exam and treatment you received today has been provided on an emergency basis only. This is not a substitute for complete medical or dental care.  Tests performed today include:  Vital signs. See below for your results today.   Medications prescribed:   Penicillin - antibiotic  You have been prescribed an antibiotic medicine: take the entire course of medicine even if you are feeling better. Stopping early can cause the antibiotic not to work.   Tramadol - narcotic-like pain medication  DO NOT drive or perform any activities that require you to be awake and alert because this medicine can make you drowsy.   Take any prescribed medications only as directed.  Home care instructions:  Follow any educational materials contained in this packet.  Follow-up instructions: Please follow-up with your dentist for further evaluation of your symptoms.   Dental Assistance: See below for dental referrals  Return instructions:   Please return to the Emergency Department if you experience worsening symptoms.  Please return if you develop a fever, you develop more swelling in your face or neck, you have trouble breathing or swallowing food.  Please return if you have any other emergent concerns.  Additional Information:  Your vital signs today were: BP 120/67 mmHg   Pulse 72   Temp(Src) 98.3 F (36.8 C) (Oral)   Resp 19   Ht 5\' 3"  (1.6 m)   Wt 106 lb (48.081 kg)   BMI 18.78 kg/m2   SpO2 99%   LMP 01/10/2015 (Approximate) If your blood pressure (BP) was elevated above 135/85 this visit, please have this repeated by your doctor within one month. -------------- Dental Care: Organization         Address  Phone  Notes  [facility] [address], [place] [phone] Accepts children up to age 26 who are enrolled in  [place] or [place] Health Choice; pregnant women with a Medicaid card; and children who have applied for Medicaid or [place] Health Choice, but were declined, whose parents can pay a reduced fee at time of service.  [facility] [place]  [address], [place] [phone] Accepts children up to age 26 who are enrolled in [place] or [place] Health Choice; pregnant women with a Medicaid card; and children who have applied for Medicaid or [place] Health Choice, but were declined, whose parents can pay a reduced fee at time of service.  [facility]  [address], [place] [phone] Patients are seen by appointment only. Walk-ins are not accepted. [facility] will see patients 26 years of age and older. Monday - Tuesday (8am-5pm) Most Wednesdays (8:30-5pm) $30 per visit, cash only  [facility]  [address], [facility] [phone] Patients are seen by appointment only. Walk-ins are not accepted. [facility] will see patients 26 years of age and older. One Wednesday Evening (Monthly: Volunteer Based).  $30 per visit, cash only  [facility]  [phone] for adults; Children under age 574, call Graduate Pediatric Dentistry at [phone]. Children aged 254-14, please call [phone] to request a pediatric application.  Dental services are provided in all areas of dental care including fillings, crowns and bridges, complete and partial dentures, implants, gum treatment, root canals, and  extractions. Preventive care is also provided. Treatment is provided to both adults and children. Patients are selected via a lottery and there is often a waiting list.   [facility] [address] [place]  [phone] [email or web address]   [facility] [address] [place], [place] [phone], Ext. [phone] Second and Fourth Thursday of each month, opens at 6:30  AM; Clinic ends at 9 AM.  Patients are seen on a first-come first-served basis, and a limited number are seen during each clinic.   [facility]  [address] [place], [place] [phone]   Eligibility Requirements You must have lived in [place], [place], or [place] counties for at least the last three months.   You cannot be eligible for state or federal sponsored [place], including [employer], [place], or [employer].   You generally cannot be eligible for healthcare insurance through your employer.    How to apply: Eligibility screenings are held every Tuesday and Wednesday afternoon from 1:00 pm until 4:00 pm. You do not need an appointment for the interview!  [facility] [address] [place], [place] [phone]   [facility] Health Department  [phone]   [facility] Health Department  [phone]   [facility] Health Department  [phone]

## 2015-01-22 NOTE — ED Provider Notes (Signed)
CSN: [id number]     Arrival date & time 01/22/15  0257 History   First MD Initiated Contact with Patient 01/22/15 [phone]     Chief Complaint  Patient presents with  . Dental Pain     (Consider location/radiation/quality/duration/timing/severity/associated sxs/prior Treatment) HPI Comments: Patient presents with complaints of left upper dental pain for one week. Patient has had problems with these teeth in the past. She reports mild facial swelling. Pain is unrelieved with Tylenol and ibuprofen. [facility] and was prescribed amoxicillin and ibuprofen. She lost this prescription. No difficulty swallowing or fever. Patient states that she is planning on seeing a dentist.  Patient is a 24 y.o. female presenting with tooth pain. The history is provided by the patient.  Dental Pain Associated symptoms: facial swelling   Associated symptoms: no fever, no headaches and no neck pain     Past Medical History  Diagnosis Date  . Pseudotumor cerebri    History reviewed. No pertinent past surgical history. History reviewed. No pertinent family history. History  Substance Use Topics  . Smoking status: Current Every Day Smoker    Types: Cigarettes  . Smokeless tobacco: Never Used  . Alcohol Use: Yes   OB History    No data available     Review of Systems  Constitutional: Negative for fever.  HENT: Positive for dental problem and facial swelling. Negative for ear pain, sore throat and trouble swallowing.   Respiratory: Negative for shortness of breath and stridor.   Musculoskeletal: Negative for neck pain.  Skin: Negative for color change.  Neurological: Negative for headaches.      Allergies  Coconut fatty acids; Shellfish allergy; Zofran; Lactose intolerance (gi); and Latex  Home Medications   Prior to Admission medications   Medication Sig Start Date End Date Taking? Authorizing Provider  ibuprofen (ADVIL,MOTRIN) 200 MG tablet Take 400 mg  by mouth every 6 (six) hours as needed for moderate pain.   Yes Historical Provider, MD  penicillin v potassium (VEETID) 500 MG tablet Take 1 tablet (500 mg total) by mouth 3 (three) times daily. 01/22/15   Renne CriglerJoshua Michie Molnar, PA-C  traMADol (ULTRAM) 50 MG tablet Take 1 tablet (50 mg total) by mouth every 6 (six) hours as needed. 01/22/15   Renne CriglerJoshua Mikeisha Lemonds, PA-C   BP 103/71 mmHg  Pulse 77  Temp(Src) 97.8 F (36.6 C) (Oral)  Resp 16  Ht 5\' 3"  (1.6 m)  Wt 106 lb (48.081 kg)  BMI 18.78 kg/m2  SpO2 98%  LMP 01/10/2015 (Approximate) Physical Exam  Constitutional: She appears well-developed and well-nourished.  HENT:  Head: Normocephalic and atraumatic.  Right Ear: Tympanic membrane, external ear and ear canal normal.  Left Ear: Tympanic membrane, external ear and ear canal normal.  Nose: Nose normal.  Mouth/Throat: Uvula is midline, oropharynx is clear and moist and mucous membranes are normal. No trismus in the jaw. Abnormal dentition. Dental caries present. No dental abscesses or uvula swelling. No tonsillar abscesses.  L maxillary tooth pain and tenderness to palpation in area of second premolar and second molar. Patient has large cavities in these teeth.. No significant swelling or erythema noted on exam.  Eyes: Conjunctivae are normal.  Neck: Normal range of motion. Neck supple.  No neck swelling or Ludwig's angina  Lymphadenopathy:    She has no cervical adenopathy.  Neurological: She is alert.  Skin: Skin is warm and dry.  Psychiatric: She has a normal mood and affect.  Nursing note  and vitals reviewed.   ED Course  Procedures (including critical care time) Labs Review Labs Reviewed - No data to display  Imaging Review No results found.   EKG Interpretation None       Patient seen and examined.   Vital signs reviewed and are as follows: BP 103/71 mmHg  Pulse 77  Temp(Src) 97.8 F (36.6 C) (Oral)  Resp 16  Ht  (1.6 m)  Wt 106 lb (48.081 kg)  BMI 18.78 kg/m2  SpO2  98%  LMP 01/10/2015 (Approximate)  Patient counseled on use of narcotic pain medications. Counseled not to combine these medications with others containing tylenol. Urged not to drink alcohol, drive, or perform any other activities that requires focus while taking these medications. The patient verbalizes understanding and agrees with the plan.  Patient counseled to take prescribed medications as directed, return with worsening facial or neck swelling, and to follow-up with their dentist as soon as possible.    MDM   Final diagnoses:  Pain, dental   Patient with toothache. No fever. Exam unconcerning for Ludwig's angina or other deep tissue infection in neck.   As there is reported facial swelling, will treat with antibiotic and pain medicine. Urged patient to follow-up with dentist.          Renne Crigler, PA-C 01/22/15 1610  Paula Libra, MD 01/22/15 [phone]

## 2015-01-22 NOTE — ED Notes (Signed)
Pt was seen recently (says, "I'm not sure what day") and was given a [place] [facility] a narcotic [place] medicine [place] [facility]. Is having left upper dental pain. Says she lost her prescription. No other c/c.

## 2015-01-29 ENCOUNTER — Emergency Department (HOSPITAL_COMMUNITY)
Admission: EM | Admit: 2015-01-29 | Discharge: 2015-01-29 | Disposition: A | Discharge status: Home / Self Care | Payer: Self-pay | Attending: Emergency Medicine | Admitting: Emergency Medicine

## 2015-01-29 ENCOUNTER — Encounter (HOSPITAL_COMMUNITY): Payer: Self-pay | Admitting: Emergency Medicine

## 2015-01-29 DIAGNOSIS — K088 Other specified disorders of teeth and supporting structures: Secondary | ICD-10-CM | POA: Insufficient documentation

## 2015-01-29 DIAGNOSIS — K0381 Cracked tooth: Secondary | ICD-10-CM | POA: Insufficient documentation

## 2015-01-29 DIAGNOSIS — Z720 Tobacco use: Secondary | ICD-10-CM | POA: Insufficient documentation

## 2015-01-29 DIAGNOSIS — Z91040 Latex allergy status: Secondary | ICD-10-CM | POA: Insufficient documentation

## 2015-01-29 DIAGNOSIS — K029 Dental caries, unspecified: Secondary | ICD-10-CM | POA: Insufficient documentation

## 2015-01-29 DIAGNOSIS — K0889 Other specified disorders of teeth and supporting structures: Secondary | ICD-10-CM

## 2015-01-29 DIAGNOSIS — Z8669 Personal history of other diseases of the nervous system and sense organs: Secondary | ICD-10-CM | POA: Insufficient documentation

## 2015-01-29 MED ORDER — PENICILLIN V POTASSIUM 250 MG PO TABS: 250.0000 mg | ORAL_TABLET | Freq: Four times a day (QID) | ORAL | Status: AC

## 2015-01-29 NOTE — ED Notes (Addendum)
Pt was seen on the third for same, c/o chip tooth causing dental pain. Also states needs a med refill because someone stole her meds.

## 2015-01-29 NOTE — Discharge Instructions (Signed)

## 2015-01-29 NOTE — ED Provider Notes (Signed)
CSN: [id number]     Arrival date & time 01/29/15  1746 History   First MD Initiated Contact with Patient 01/29/15 1749     Chief Complaint  Patient presents with  . Dental Pain     (Consider location/radiation/quality/duration/timing/severity/associated sxs/prior Treatment) HPI Comments: Pt comes with c/o left upper dental pain for 2 weeks. Denies swallowing or problems breathing. She states that she was seen in the er on the 7/3 and her medication were taken the next day. Pt is continuing to have pain  The history is provided by the patient. No language interpreter was used.    Past Medical History  Diagnosis Date  . Pseudotumor cerebri    History reviewed. No pertinent past surgical history. History reviewed. No pertinent family history. History  Substance Use Topics  . Smoking status: Current Every Day Smoker    Types: Cigarettes  . Smokeless tobacco: Never Used  . Alcohol Use: Yes   OB History    No data available     Review of Systems  All other systems reviewed and are negative.     Allergies  Coconut fatty acids; Shellfish allergy; Zofran; Lactose intolerance (gi); and Latex  Home Medications   Prior to Admission medications   Medication Sig Start Date End Date Taking? Authorizing Provider  ibuprofen (ADVIL,MOTRIN) 200 MG tablet Take 400 mg by mouth every 6 (six) hours as needed for moderate pain.    Historical Provider, MD  penicillin v potassium (VEETID) 250 MG tablet Take 1 tablet (250 mg total) by mouth 4 (four) times daily. 01/29/15 02/05/15  Teressa LowerVrinda Lameka Disla, NP  traMADol (ULTRAM) 50 MG tablet Take 1 tablet (50 mg total) by mouth every 6 (six) hours as needed. 01/22/15   Renne CriglerJoshua Geiple, PA-C   BP 115/79 mmHg  Pulse 81  Temp(Src) 97.2 F (36.2 C) (Oral)  Resp 18  SpO2 100%  LMP 01/10/2015 (Approximate) Physical Exam  Constitutional: She appears well-developed and well-nourished.  HENT:  Right Ear: External ear normal.  Left Ear: External ear normal.   Multiple decayed and cracked teeth to the left upper dentition. No gum or jaw swelling noted  Cardiovascular: Normal rate and regular rhythm.   Pulmonary/Chest: Effort normal and breath sounds normal.  Nursing note and vitals reviewed.   ED Course  Procedures (including critical care time) Labs Review Labs Reviewed - No data to display  Imaging Review No results found.   EKG Interpretation None      MDM   Final diagnoses:  Toothache    Pt given pcn and dental referral. No sign of ludwigs angina    Teressa LowerVrinda Kissa Campoy, NP 01/29/15 1806  Purvis SheffieldForrest Harrison, MD 01/29/15 2329
